# Patient Record
Sex: Female | Born: 1990 | Race: Black or African American | Hispanic: No | Marital: Single | Smoking: Never smoker
Health system: Southern US, Community
[De-identification: ages and names within clinical notes are randomized; demographics above are authoritative.]

---

## 2019-06-16 ENCOUNTER — Other Ambulatory Visit: Payer: Self-pay

## 2019-06-16 ENCOUNTER — Emergency Department (HOSPITAL_BASED_OUTPATIENT_CLINIC_OR_DEPARTMENT_OTHER)
Admission: EM | Admit: 2019-06-16 | Discharge: 2019-06-17 | Disposition: A | Discharge status: Home / Self Care | Payer: Medicaid Other | Attending: Emergency Medicine | Admitting: Emergency Medicine

## 2019-06-16 ENCOUNTER — Encounter (HOSPITAL_BASED_OUTPATIENT_CLINIC_OR_DEPARTMENT_OTHER): Payer: Self-pay | Admitting: *Deleted

## 2019-06-16 DIAGNOSIS — R6889 Other general symptoms and signs: Secondary | ICD-10-CM

## 2019-06-16 DIAGNOSIS — R6883 Chills (without fever): Secondary | ICD-10-CM | POA: Diagnosis present

## 2019-06-16 DIAGNOSIS — M7918 Myalgia, other site: Secondary | ICD-10-CM | POA: Diagnosis not present

## 2019-06-16 DIAGNOSIS — J111 Influenza due to unidentified influenza virus with other respiratory manifestations: Secondary | ICD-10-CM | POA: Insufficient documentation

## 2019-06-16 LAB — URINALYSIS, ROUTINE W REFLEX MICROSCOPIC
Bilirubin Urine: NEGATIVE
Glucose, UA: NEGATIVE mg/dL
Hgb urine dipstick: NEGATIVE
Ketones, ur: NEGATIVE mg/dL
Leukocytes,Ua: NEGATIVE
Nitrite: NEGATIVE
Protein, ur: NEGATIVE mg/dL
Specific Gravity, Urine: <1.005 — ABNORMAL LOW (ref 1.005–1.030)
pH: 6 (ref 5.0–8.0)

## 2019-06-16 LAB — RAPID INFLUENZA A&B ANTIGENS
Influenza A (ARMC): NEGATIVE
Influenza B (ARMC): NEGATIVE

## 2019-06-16 NOTE — ED Provider Notes (Signed)
[facility] EMERGENCY DEPARTMENT Provider Note   CSN: [id number] Arrival date & time: 06/16/19  2150     History No chief complaint on file.   Angelica Walsh is a 29 y.o. female.  Patient is a 29 year old female who presents with chills.  She states that today she noticed that she was having some achiness in her muscles and had an episode where she had some chills.  She has a mild headache.  No known fevers.  No cough or cold symptoms.  No shortness of breath.  No urinary symptoms.  She was recently treated for bacterial vaginosis a week ago and currently finished her last dose of Flagyl today.  She denies any significant abdominal pain.  No vomiting or diarrhea.  No rashes.  She had Covid at the end of November.        History reviewed. No pertinent past medical history.  There are no problems to display for this patient.   History reviewed. No pertinent surgical history.   OB History   No obstetric history on file.     No family history on file.  Social History   Tobacco Use  . Smoking status: Never Smoker  . Smokeless tobacco: Never Used  Substance Use Topics  . Alcohol use: Yes  . Drug use: Never    Home Medications Prior to Admission medications   Not on File    Allergies    Patient has no known allergies.  Review of Systems   Review of Systems  Constitutional: Positive for chills and fatigue. Negative for diaphoresis and fever.  HENT: Negative for congestion, rhinorrhea and sneezing.   Eyes: Negative.   Respiratory: Negative for cough, chest tightness and shortness of breath.   Cardiovascular: Negative for chest pain and leg swelling.  Gastrointestinal: Negative for abdominal pain, blood in stool, diarrhea, nausea and vomiting.  Genitourinary: Negative for difficulty urinating, flank pain, frequency and hematuria.  Musculoskeletal: Positive for myalgias. Negative for arthralgias and back pain.  Skin: Negative for rash.  Neurological:  Negative for dizziness, speech difficulty, weakness, numbness and headaches.    Physical Exam Updated Vital Signs BP 116/79   Pulse 89   Temp 98.8 F (37.1 C) (Oral)   Resp 18   Ht 5\' 3"  (1.6 m)   Wt 102.5 kg   LMP 05/23/2019   SpO2 100%   BMI 40.03 kg/m   Physical Exam Constitutional:      Appearance: She is well-developed.  HENT:     Head: Normocephalic and atraumatic.  Eyes:     Pupils: Pupils are equal, round, and reactive to light.  Cardiovascular:     Rate and Rhythm: Normal rate and regular rhythm.     Heart sounds: Normal heart sounds.  Pulmonary:     Effort: Pulmonary effort is normal. No respiratory distress.     Breath sounds: Normal breath sounds. No wheezing or rales.  Chest:     Chest wall: No tenderness.  Abdominal:     General: Bowel sounds are normal.     Palpations: Abdomen is soft.     Tenderness: There is no abdominal tenderness. There is no guarding or rebound.  Musculoskeletal:        General: Normal range of motion.     Cervical back: Normal range of motion and neck supple.  Lymphadenopathy:     Cervical: No cervical adenopathy.  Skin:    General: Skin is warm and dry.     Findings: No rash.  Neurological:     Mental Status: She is alert and oriented to person, place, and time.     ED Results / Procedures / Treatments   Labs (all labs ordered are listed, but only abnormal results are displayed) Labs Reviewed  RAPID INFLUENZA A&B ANTIGENS  URINALYSIS, ROUTINE W REFLEX MICROSCOPIC  PREGNANCY, URINE    EKG None  Radiology No results found.  Procedures Procedures (including critical care time)  Medications Ordered in ED Medications - No data to display  ED Course  I have reviewed the triage vital signs and the nursing notes.  Pertinent labs & imaging results that were available during my care of the patient were reviewed by me and considered in my medical decision making (see chart for details).    MDM  Rules/Calculators/A&P                      Patient is a 29 year old female who presents with chills and myalgias.  No documented fevers.  No URI symptoms.  No cough or chest congestion.  She had Covid in November.  She had a rapid flu that was negative.  U/a is pending.  She is otherwise well appearing with normal vital signs and I anticipate discharge.  Dr. Read Drivers to take over care pending u/a. Final Clinical Impression(s) / ED Diagnoses Final diagnoses:  None    Rx / DC Orders ED Discharge Orders    None       Rolan Bucco, MD 06/16/19 2330

## 2019-06-16 NOTE — ED Triage Notes (Signed)
She had Covid a month ago. Here today with chills, body aches, and headache.

## 2019-06-16 NOTE — ED Notes (Signed)
Pt is aware that a urine specimen is needed.

## 2019-06-17 LAB — PREGNANCY, URINE: Preg Test, Ur: NEGATIVE

## 2019-07-31 ENCOUNTER — Encounter (HOSPITAL_BASED_OUTPATIENT_CLINIC_OR_DEPARTMENT_OTHER): Payer: Self-pay | Admitting: Emergency Medicine

## 2019-07-31 ENCOUNTER — Emergency Department (HOSPITAL_BASED_OUTPATIENT_CLINIC_OR_DEPARTMENT_OTHER)
Admission: EM | Admit: 2019-07-31 | Discharge: 2019-07-31 | Disposition: A | Discharge status: Home / Self Care | Payer: Medicaid Other | Attending: Emergency Medicine | Admitting: Emergency Medicine

## 2019-07-31 ENCOUNTER — Other Ambulatory Visit: Payer: Self-pay

## 2019-07-31 DIAGNOSIS — R509 Fever, unspecified: Secondary | ICD-10-CM | POA: Diagnosis present

## 2019-07-31 DIAGNOSIS — J111 Influenza due to unidentified influenza virus with other respiratory manifestations: Secondary | ICD-10-CM | POA: Insufficient documentation

## 2019-07-31 DIAGNOSIS — Z20822 Contact with and (suspected) exposure to covid-19: Secondary | ICD-10-CM | POA: Insufficient documentation

## 2019-07-31 NOTE — ED Provider Notes (Addendum)
[facility] EMERGENCY DEPARTMENT Provider Note   CSN: [id number] Arrival date & time: 07/31/19  1204     History Chief Complaint  Patient presents with  . Generalized Body Aches  . Headache    Angelica Walsh is a 29 y.o. female.  29 yo F with a chief complaint of fevers, chills and myalgias.  Going on for about a week now.  Patient has been seen in urgent care twice as well as seeing the family doctor couple days ago.  She has had a headache off and on with this.  Was started on antibiotics for possible sinusitis.  Denies cough denies shortness of breath denies nausea vomiting or diarrhea denies abdominal pain.  The history is provided by the patient.  Headache Associated symptoms: fever   Associated symptoms: no congestion, no dizziness, no myalgias, no nausea and no vomiting   Illness Severity:  Moderate Onset quality:  Gradual Duration:  1 week Timing:  Constant Progression:  Worsening Chronicity:  New Associated symptoms: fever and headaches   Associated symptoms: no chest pain, no congestion, no myalgias, no nausea, no rhinorrhea, no shortness of breath, no vomiting and no wheezing        History reviewed. No pertinent past medical history.  There are no problems to display for this patient.   History reviewed. No pertinent surgical history.   OB History   No obstetric history on file.     No family history on file.  Social History   Tobacco Use  . Smoking status: Never Smoker  . Smokeless tobacco: Never Used  Substance Use Topics  . Alcohol use: Yes  . Drug use: Never    Home Medications Prior to Admission medications   Not on File    Allergies    Patient has no known allergies.  Review of Systems   Review of Systems  Constitutional: Positive for chills and fever.  HENT: Negative for congestion and rhinorrhea.   Eyes: Negative for redness and visual disturbance.  Respiratory: Negative for shortness of breath and wheezing.     Cardiovascular: Negative for chest pain and palpitations.  Gastrointestinal: Negative for nausea and vomiting.  Genitourinary: Negative for dysuria and urgency.  Musculoskeletal: Negative for arthralgias and myalgias.  Skin: Negative for pallor and wound.  Neurological: Positive for headaches. Negative for dizziness.    Physical Exam Updated Vital Signs BP (!) 131/92 (BP Location: Left Arm)   Pulse 83   Temp 98.7 F (37.1 C) (Oral)   Resp 18   Ht 5\' 3"  (1.6 m)   Wt 101.2 kg   LMP 07/24/2019   SpO2 100%   BMI 39.50 kg/m   Physical Exam Vitals and nursing note reviewed.  Constitutional:      General: She is not in acute distress.    Appearance: She is well-developed. She is not diaphoretic.  HENT:     Head: Normocephalic and atraumatic.     Comments: Swollen turbinates, posterior nasal drip, no noted sinus ttp, tm normal bilaterally.   Eyes:     Pupils: Pupils are equal, round, and reactive to light.  Cardiovascular:     Rate and Rhythm: Normal rate and regular rhythm.     Heart sounds: No murmur. No friction rub. No gallop.   Pulmonary:     Effort: Pulmonary effort is normal.     Breath sounds: No wheezing or rales.  Abdominal:     General: There is no distension.     Palpations: Abdomen is  soft.     Tenderness: There is no abdominal tenderness.  Musculoskeletal:        General: No tenderness.     Cervical back: Normal range of motion and neck supple.  Skin:    General: Skin is warm and dry.  Neurological:     Mental Status: She is alert and oriented to person, place, and time.  Psychiatric:        Behavior: Behavior normal.     ED Results / Procedures / Treatments   Labs (all labs ordered are listed, but only abnormal results are displayed) Labs Reviewed  NOVEL CORONAVIRUS, NAA (HOSP ORDER, SEND-OUT TO REF LAB; TAT 18-24 HRS)    EKG None  Radiology No results found.  Procedures Procedures (including critical care time)  Medications Ordered in  ED Medications - No data to display  ED Course  I have reviewed the triage vital signs and the nursing notes.  Pertinent labs & imaging results that were available during my care of the patient were reviewed by me and considered in my medical decision making (see chart for details).    MDM Rules/Calculators/A&P                      29 yo F with a cc of fevers and chills.  Occurring during the coronavirus pandemic.  Will test. Well appearing, afebrile here.  No sob.  No signs of meningitis, no neck stiffness or rigidity negative Kernig's and Brudzinski's.  Discharge home.  Anylah Scheib was evaluated in Emergency Department on 07/31/2019 for the symptoms described in the history of present illness. He/she was evaluated in the context of the global COVID-19 pandemic, which necessitated consideration that the patient might be at risk for infection with the SARS-CoV-2 virus that causes COVID-19. Institutional protocols and algorithms that pertain to the evaluation of patients at risk for COVID-19 are in a state of rapid change based on information released by regulatory bodies including the CDC and federal and state organizations. These policies and algorithms were followed during the patient's care in the ED.    3:30 PM:  I have discussed the diagnosis/risks/treatment options with the patient and believe the pt to be eligible for discharge home to follow-up with PCP. We also discussed returning to the ED immediately if new or worsening sx occur. We discussed the sx which are most concerning (e.g., sudden worsening pain, fever, inability to tolerate by mouth) that necessitate immediate return. Medications administered to the patient during their visit and any new prescriptions provided to the patient are listed below.  Medications given during this visit Medications - No data to display   The patient appears reasonably screen and/or stabilized for discharge and I doubt any other medical  condition or other [facility] requiring further screening, evaluation, or treatment in the ED at this time prior to discharge.   Final Clinical Impression(s) / ED Diagnoses Final diagnoses:  Influenza-like illness    Rx / DC Orders ED Discharge Orders    None       Melene Plan, DO 07/31/19 1530    Melene Plan, DO 07/31/19 1544

## 2019-07-31 NOTE — ED Triage Notes (Addendum)
Pt states that she has had generalized body aches since Friday  - pt states that it is worse with movement. She denies any pain at this time

## 2019-07-31 NOTE — Discharge Instructions (Signed)
Return for worsening trouble breathing, abdominal pain, inability to eat or drink.

## 2019-08-01 ENCOUNTER — Telehealth: Payer: Self-pay | Admitting: *Deleted

## 2019-08-01 LAB — NOVEL CORONAVIRUS, NAA (HOSP ORDER, SEND-OUT TO REF LAB; TAT 18-24 HRS): SARS-CoV-2, NAA: NOT DETECTED

## 2019-08-01 NOTE — Telephone Encounter (Signed)
Patient called given negative covid results,patient requested contact information for [facility]

## 2020-04-14 ENCOUNTER — Emergency Department (HOSPITAL_BASED_OUTPATIENT_CLINIC_OR_DEPARTMENT_OTHER): Payer: Medicaid Other

## 2020-04-14 ENCOUNTER — Encounter (HOSPITAL_BASED_OUTPATIENT_CLINIC_OR_DEPARTMENT_OTHER): Payer: Self-pay | Admitting: *Deleted

## 2020-04-14 ENCOUNTER — Other Ambulatory Visit: Payer: Self-pay

## 2020-04-14 ENCOUNTER — Emergency Department (HOSPITAL_BASED_OUTPATIENT_CLINIC_OR_DEPARTMENT_OTHER)
Admission: EM | Admit: 2020-04-14 | Discharge: 2020-04-15 | Disposition: A | Discharge status: Home / Self Care | Payer: Medicaid Other | Attending: Emergency Medicine | Admitting: Emergency Medicine

## 2020-04-14 DIAGNOSIS — R0602 Shortness of breath: Secondary | ICD-10-CM | POA: Insufficient documentation

## 2020-04-14 DIAGNOSIS — K219 Gastro-esophageal reflux disease without esophagitis: Secondary | ICD-10-CM | POA: Diagnosis not present

## 2020-04-14 DIAGNOSIS — R1012 Left upper quadrant pain: Secondary | ICD-10-CM | POA: Diagnosis present

## 2020-04-14 MED ORDER — ALUM & MAG HYDROXIDE-SIMETH 200-200-20 MG/5ML PO SUSP
30.0000 mL | Freq: Once | ORAL | Status: AC
  Administered 2020-04-14: 30 mL via ORAL
  Filled 2020-04-14: qty 30

## 2020-04-14 MED ORDER — LIDOCAINE VISCOUS HCL 2 % MT SOLN
15.0000 mL | Freq: Once | OROMUCOSAL | Status: AC
  Administered 2020-04-14: 15 mL via ORAL
  Filled 2020-04-14: qty 15

## 2020-04-14 NOTE — ED Notes (Signed)
Pt to Xray.

## 2020-04-14 NOTE — ED Notes (Signed)
EDP at bedside  

## 2020-04-14 NOTE — ED Triage Notes (Signed)
Reports having heartburn for several days.  Ate dinner around 7pm felt full and SOB.  Reports she induced vomiting thinking it would help.  Reports it didn't help but continued to vomit multiple times since.

## 2020-04-15 NOTE — ED Provider Notes (Signed)
[facility] EMERGENCY DEPARTMENT Provider Note  CSN: [id number] Arrival date & time: 04/14/20 2210  Chief Complaint(s) Heartburn and Shortness of Breath  HPI Angelica Walsh is a 29 y.o. female   The history is provided by the patient.  Gastroesophageal Reflux This is a recurrent problem. The current episode started 1 to 2 hours ago. The problem occurs constantly. The problem has not changed since onset.Associated symptoms include abdominal pain (LUQ discomfort) and shortness of breath (after emesis). Pertinent negatives include no chest pain and no headaches. The symptoms are aggravated by eating. Relieved by: self induced emesis. She has tried nothing for the symptoms.    Past Medical History History reviewed. No pertinent past medical history. There are no problems to display for this patient.  Home Medication(s) Prior to Admission medications   Not on File                                                                                                                                    Past Surgical History History reviewed. No pertinent surgical history. Family History No family history on file.  Social History Social History   Tobacco Use  . Smoking status: Never Smoker  . Smokeless tobacco: Never Used  Vaping Use  . Vaping Use: Never used  Substance Use Topics  . Alcohol use: Yes  . Drug use: Never   Allergies Patient has no known allergies.  Review of Systems Review of Systems  Respiratory: Positive for shortness of breath (after emesis).   Cardiovascular: Negative for chest pain.  Gastrointestinal: Positive for abdominal pain (LUQ discomfort).  Neurological: Negative for headaches.   All other systems are reviewed and are negative for acute change except as noted in the HPI  Physical Exam Vital Signs  I have reviewed the triage vital signs BP 111/69   Pulse 100   Temp 98.6 F (37 C) (Oral)   Resp 20   Ht 5\' 3"  (1.6 m)   Wt 102.5 kg    LMP 03/17/2020   SpO2 100%   BMI 40.03 kg/m   Physical Exam Vitals reviewed.  Constitutional:      General: She is not in acute distress.    Appearance: She is well-developed and overweight. She is not diaphoretic.  HENT:     Head: Normocephalic and atraumatic.     Nose: Nose normal.  Eyes:     General: No scleral icterus.       Right eye: No discharge.        Left eye: No discharge.     Conjunctiva/sclera: Conjunctivae normal.     Pupils: Pupils are equal, round, and reactive to light.  Cardiovascular:     Rate and Rhythm: Normal rate and regular rhythm.     Heart sounds: No murmur heard.  No friction rub. No gallop.   Pulmonary:     Effort: Pulmonary effort is normal. No respiratory distress.  Breath sounds: Normal breath sounds. No stridor. No rales.  Abdominal:     General: There is no distension.     Palpations: Abdomen is soft.     Tenderness: There is no abdominal tenderness.  Musculoskeletal:        General: No tenderness.     Cervical back: Normal range of motion and neck supple.  Skin:    General: Skin is warm and dry.     Findings: No erythema or rash.  Neurological:     Mental Status: She is alert and oriented to person, place, and time.     ED Results and Treatments Labs (all labs ordered are listed, but only abnormal results are displayed) Labs Reviewed - No data to display                                                                                                                       EKG  EKG Interpretation  Date/Time:  Saturday April 14 2020 22:35:30 EDT Ventricular Rate:  79 PR Interval:    QRS Duration: 96 QT Interval:  376 QTC Calculation: 431 R Axis:   54 Text Interpretation: Sinus rhythm NO STEMI. No old tracing to compare Confirmed by Drema Pry [phone]) on 04/14/2020 11:39:18 PM      Radiology DG Chest 2 View  Result Date: 04/14/2020 CLINICAL DATA:  Shortness of breath, heartburn for several days EXAM: CHEST - 2 VIEW  COMPARISON:  Radiograph 06/02/2019 FINDINGS: No consolidation, features of edema, pneumothorax, or effusion. Pulmonary vascularity is normally distributed. The cardiomediastinal contours are unremarkable. No acute osseous or soft tissue abnormality. IMPRESSION: No acute cardiopulmonary abnormality. Electronically Signed   By: Kreg Shropshire M.D.   On: 04/14/2020 22:56    Pertinent labs & imaging results that were available during my care of the patient were reviewed by me and considered in my medical decision making (see chart for details).  Medications Ordered in ED Medications  alum & mag hydroxide-simeth (MAALOX/MYLANTA) 200-200-20 MG/5ML suspension 30 mL (30 mLs Oral Given 04/14/20 2347)    And  lidocaine (XYLOCAINE) 2 % viscous mouth solution 15 mL (15 mLs Oral Given 04/14/20 2347)                                                                                                                                    Procedures Procedures  (including critical care time)  Medical Decision Making /  ED Course I have reviewed the nursing notes for this encounter and the patient's prior records (if available in EHR or on provided paperwork).   Angelica Walsh was evaluated in Emergency Department on 04/15/2020 for the symptoms described in the history of present illness. She was evaluated in the context of the global COVID-19 pandemic, which necessitated consideration that the patient might be at risk for infection with the SARS-CoV-2 virus that causes COVID-19. Institutional protocols and algorithms that pertain to the evaluation of patients at risk for COVID-19 are in a state of rapid change based on information released by regulatory bodies including the CDC and federal and state organizations. These policies and algorithms were followed during the patient's care in the ED.  Presented with upper abd discomfort after heavy meal. Self induced emesis, which usually helps, did not today. Mild SOB related  to episode, not currently.  abd benign. Lungs clear.  Triage orders: EKG: nonischemic CXR: without evidence suggestive of pneumonia, pneumothorax, pneumomediastinum.  No abnormal contour of the mediastinum to suggest dissection. No evidence of acute injuries.  Low suspicion for serious intra-abdominal [place]/infectious process or bowel obstruction quiring additional work-up or imaging at this time.  On review of records, recent labs revealed the patient is not a diabetic. Offered pregnancy test which patient declined.  She was provided with GI cocktail which completely resolved her symptoms.  She is able to tolerate oral intake.     Final Clinical Impression(s) / ED Diagnoses Final diagnoses:  Gastroesophageal reflux disease, unspecified whether esophagitis present   The patient appears reasonably screened and/or stabilized for discharge and I doubt any other medical condition or other [facility] requiring further screening, evaluation, or treatment in the ED at this time prior to discharge. Safe for discharge with strict return precautions.  Disposition: Discharge  Condition: Good  I have discussed the results, Dx and Tx plan with the patient/family who expressed understanding and agree(s) with the plan. Discharge instructions discussed at length. The patient/family was given strict return precautions who verbalized understanding of the instructions. No further questions at time of discharge.    ED Discharge Orders    None       Follow Up: Primary care provider   If you do not have a primary care physician, contact [facility] at [phone] for referral      This chart was dictated using voice recognition software.  Despite best efforts to proofread,  errors can occur which can change the documentation meaning.   Nira Conn, MD 04/15/20 [phone]

## 2020-06-12 ENCOUNTER — Other Ambulatory Visit: Payer: Self-pay

## 2020-06-12 ENCOUNTER — Emergency Department (HOSPITAL_BASED_OUTPATIENT_CLINIC_OR_DEPARTMENT_OTHER)
Admission: EM | Admit: 2020-06-12 | Discharge: 2020-06-12 | Disposition: A | Discharge status: Home / Self Care | Payer: Medicaid Other | Attending: Emergency Medicine | Admitting: Emergency Medicine

## 2020-06-12 ENCOUNTER — Encounter (HOSPITAL_BASED_OUTPATIENT_CLINIC_OR_DEPARTMENT_OTHER): Payer: Self-pay | Admitting: Emergency Medicine

## 2020-06-12 DIAGNOSIS — U071 COVID-19: Secondary | ICD-10-CM | POA: Insufficient documentation

## 2020-06-12 DIAGNOSIS — R059 Cough, unspecified: Secondary | ICD-10-CM | POA: Diagnosis present

## 2020-06-12 DIAGNOSIS — J069 Acute upper respiratory infection, unspecified: Secondary | ICD-10-CM

## 2020-06-12 DIAGNOSIS — Z20822 Contact with and (suspected) exposure to covid-19: Secondary | ICD-10-CM

## 2020-06-12 LAB — RESP PANEL BY RT-PCR (FLU A&B, COVID) ARPGX2
Influenza A by PCR: NEGATIVE
Influenza B by PCR: NEGATIVE
SARS Coronavirus 2 by RT PCR: POSITIVE — AB

## 2020-06-12 MED ORDER — BENZONATATE 100 MG PO CAPS: 100.0000 mg | ORAL_CAPSULE | Freq: Three times a day (TID) | ORAL | 0 refills | Status: AC

## 2020-06-12 NOTE — ED Notes (Signed)
Covid Swab obtained and to the lab 

## 2020-06-12 NOTE — Discharge Instructions (Addendum)
At this time there does not appear to be the presence of an emergent medical condition, however there is always the potential for conditions to change. Please read and follow the below instructions.  Please return to the Emergency Department immediately for any new or worsening symptoms. Please be sure to follow up with your Primary Care Provider within one week regarding your visit today; please call their office to schedule an appointment even if you are feeling better for a follow-up visit. Your Covid test and influenza tests are currently in process they should result in the next 1 day.  Please check your MyChart account for those results and discuss them with your primary care provider as soon as they are available.  Please drink plenty of water and get plenty of rest.  You may use over-the-counter anti-inflammatory such as Tylenol as directed on the packaging to help with your symptoms. You may use the medication Tessalon as prescribed to help with your cough.  If you are pregnant or breast-feeding do not take this medication ask your OB/GYN before taking any cough medications.  Go to the nearest Emergency Department immediately if: You have fever or chills You feel pain or pressure in your chest. You have abdominal pain You have vomiting and cannot keep down food or water if your Covid test is positive you will need to quarantine for 10 days.   You have shortness of breath. You faint or feel like you will faint. You keep throwing up (vomiting). You feel confused. You have any new/concerning or worsening symptoms.   Please read the additional information packets attached to your discharge summary.  Do not take your medicine if  develop an itchy rash, swelling in your mouth or lips, or difficulty breathing; call 911 and seek immediate emergency medical attention if this occurs.  You may review your lab tests and imaging results in their entirety on your MyChart account.  Please discuss  all results of fully with your primary care provider and other specialist at your follow-up visit.  Note: Portions of this text may have been transcribed using voice recognition software. Every effort was made to ensure accuracy; however, inadvertent computerized transcription errors may still be present.

## 2020-06-12 NOTE — ED Provider Notes (Signed)
[facility] EMERGENCY DEPARTMENT Provider Note   CSN: [id number] Arrival date & time: 06/12/20  1326     History Chief Complaint  Patient presents with  . Cough    Angelica Walsh is a 30 y.o. female reports herself as otherwise healthy no daily medication use presents today for Covid and flu test.  Patient reports that her nephew tested positive for influenza A last week.  Patient reports that yesterday she developed nasal congestion, rhinorrhea, generalized body aches and nonproductive cough.  Symptoms have been constant since onset she has been using over-the-counter flu and cold remedies with some relief.  She reports body aches as a mild aching sensation constant nonradiating no aggravating factors.  Denies fever/chills, headache, vision changes, neck stiffness, sore throat, chest pain/shortness of breath, hemoptysis, abdominal pain, nausea/vomiting, diarrhea, extremity swelling/color change or any additional concerns.  Of note patient reports that she is unvaccinated against flu and Covid  HPI     History reviewed. No pertinent past medical history.  There are no problems to display for this patient.   History reviewed. No pertinent surgical history.   OB History   No obstetric history on file.     History reviewed. No pertinent family history.  Social History   Tobacco Use  . Smoking status: Never Smoker  . Smokeless tobacco: Never Used  Vaping Use  . Vaping Use: Never used  Substance Use Topics  . Alcohol use: Yes  . Drug use: Never    Home Medications Prior to Admission medications   Medication Sig Start Date End Date Taking? Authorizing Provider  benzonatate (TESSALON) 100 MG capsule Take 1 capsule (100 mg total) by mouth every 8 (eight) hours. 06/12/20  Yes Harlene Salts A, PA-C    Allergies    Patient has no known allergies.  Review of Systems   Review of Systems  Constitutional: Negative.  Negative for chills and fever.  HENT:  Positive for rhinorrhea. Negative for facial swelling, sore throat and voice change.   Eyes: Negative.  Negative for visual disturbance.  Respiratory: Positive for cough. Negative for shortness of breath.   Cardiovascular: Negative.  Negative for chest pain and leg swelling.  Gastrointestinal: Negative.  Negative for abdominal pain, diarrhea, nausea and vomiting.  Musculoskeletal: Positive for arthralgias and myalgias.  Neurological: Negative.  Negative for headaches.    Physical Exam Updated Vital Signs BP 105/78 (BP Location: Left Arm)   Pulse 75   Temp 98.8 F (37.1 C) (Oral)   Resp 16   Ht 5\' 3"  (1.6 m)   Wt 96.6 kg   LMP 06/05/2020   SpO2 100%   BMI 37.73 kg/m   Physical Exam Constitutional:      General: She is not in acute distress.    Appearance: Normal appearance. She is well-developed. She is not ill-appearing or diaphoretic.  HENT:     Head: Normocephalic and atraumatic.     Jaw: There is normal jaw occlusion.     Right Ear: External ear normal.     Left Ear: External ear normal.     Nose: Rhinorrhea present. Rhinorrhea is clear.     Right Sinus: No maxillary sinus tenderness or frontal sinus tenderness.     Left Sinus: No maxillary sinus tenderness or frontal sinus tenderness.     Mouth/Throat:     Mouth: Mucous membranes are moist.     Pharynx: Oropharynx is clear. Uvula midline.     Comments: Mild postnasal drip and posterior pharynx cobblestoning  Eyes:     General: Vision grossly intact. Gaze aligned appropriately.     Extraocular Movements: Extraocular movements intact.     Conjunctiva/sclera: Conjunctivae normal.     Pupils: Pupils are equal, round, and reactive to light.  Neck:     Trachea: Trachea and phonation normal. No tracheal tenderness or tracheal deviation.     Meningeal: Brudzinski's sign absent.  Cardiovascular:     Rate and Rhythm: Normal rate and regular rhythm.  Pulmonary:     Effort: Pulmonary effort is normal. No accessory muscle  usage or respiratory distress.     Breath sounds: Normal breath sounds and air entry.  Abdominal:     General: There is no distension.     Palpations: Abdomen is soft.     Tenderness: There is no abdominal tenderness. There is no guarding or rebound.  Musculoskeletal:        General: Normal range of motion.     Cervical back: Normal range of motion and neck supple.     Right lower leg: No edema.     Left lower leg: No edema.  Skin:    General: Skin is warm and dry.  Neurological:     Mental Status: She is alert.     GCS: GCS eye subscore is 4. GCS verbal subscore is 5. GCS motor subscore is 6.     Comments: Speech is clear and goal oriented, follows commands Major Cranial nerves without deficit, no facial droop Moves extremities without ataxia, coordination intact  Psychiatric:        Behavior: Behavior normal.     ED Results / Procedures / Treatments   Labs (all labs ordered are listed, but only abnormal results are displayed) Labs Reviewed  RAPID INFLUENZA A&B ANTIGENS  SARS CORONAVIRUS 2 (TAT 6-24 HRS)    EKG None  Radiology No results found.  Procedures Procedures (including critical care time)  Medications Ordered in ED Medications - No data to display  ED Course  I have reviewed the triage vital signs and the nursing notes.  Pertinent labs & imaging results that were available during my care of the patient were reviewed by me and considered in my medical decision making (see chart for details).    MDM Rules/Calculators/A&P                         Additional history obtained from: 1. Nursing notes from this visit. ----------- 30 year old otherwise healthy female presenting for evaluation URI illness onset yesterday.  She reports rhinorrhea, congestion, body aches and nonproductive cough.  She has no chest pain or shortness of breath, no GI symptoms.  She reports that her nephew recently tested positive for influenza A.  Patient is unvaccinated against  influenza and Covid.  On exam she is well-appearing no acute distress.  Cranial nerves intact, no meningeal signs.  Airway clear without evidence of PTA, RPA, Ludewig's, tonsillitis, sinusitis or other deep space infections of the head or neck.  Cardiopulmonary exam unremarkable.  Abdomen soft nontender without peritoneal signs.  Neurovascular tact to all 4 extremities without evidence of DVT.  Vital signs are within normal limits without hypoxia, tachycardia, tachypnea, hypotension or fever.  There is no indication for imaging or blood work at this time.  Will obtain Covid and influenza tests.  Patient requesting discharge will discharge patient to home and she can follow-up on her test on her MyChart account.  Patient encouraged to call her PCP once her  results are available and schedule follow-up appointment.  Encourage patient to drink plenty water to avoid dehydration and get plenty of rest.  Discussed isolation and quarantine precautions with patient and she stated understanding.  Work note given.  Tessalon prescribed for cough.  Suspect patient symptoms today secondary to viral URI likely Covid versus influenza.  No evidence for bacterial infection requiring antibiotic or further work-up at this time.  Of note patient denies chance of pregnancy today.  Patient states understanding that medications given or prescribed today may result in harm to of a pregnancy and she accepts these risks and still chooses not to be pregnancy tested and proceed with medications.  Additionally patient denies breast-feeding.  At this time there does not appear to be any evidence of an acute emergency medical condition and the patient appears stable for discharge with appropriate outpatient follow up. Diagnosis was discussed with patient who verbalizes understanding of care plan and is agreeable to discharge. I have discussed return precautions with patient who verbalizes understanding. Patient encouraged to follow-up with  their PCP . All questions answered.   Angelica Walsh was evaluated in Emergency Department on 06/12/2020 for the symptoms described in the history of present illness. She was evaluated in the context of the global COVID-19 pandemic, which necessitated consideration that the patient might be at risk for infection with the SARS-CoV-2 virus that causes COVID-19. Institutional protocols and algorithms that pertain to the evaluation of patients at risk for COVID-19 are in a state of rapid change based on information released by regulatory bodies including the CDC and federal and state organizations. These policies and algorithms were followed during the patient's care in the ED.   Note: Portions of this report may have been transcribed using voice recognition software. Every effort was made to ensure accuracy; however, inadvertent computerized transcription errors may still be present. Final Clinical Impression(s) / ED Diagnoses Final diagnoses:  Viral URI with cough  Encounter for laboratory testing for COVID-19 virus    Rx / DC Orders ED Discharge Orders         Ordered    benzonatate (TESSALON) 100 MG capsule  Every 8 hours        06/12/20 1907           Elizabeth Palau 06/12/20 1908    Milagros Loll, MD 06/19/20 [phone]

## 2020-06-12 NOTE — ED Notes (Signed)
Pt made aware of longest wait in WR at time of triage

## 2020-06-12 NOTE — ED Triage Notes (Signed)
Pt arrives pov with c/o cough with congestion and generalized body aches that started yesterday. Pt denies fever

## 2020-06-13 ENCOUNTER — Ambulatory Visit: Payer: Self-pay | Admitting: *Deleted

## 2020-06-13 NOTE — Telephone Encounter (Signed)
Patient notified of positive COVID-19 test results. Pt verbalized understanding. Pt reports symptoms of flu like symptoms Criteria for self-isolation:  -Please quarantine and isolate at home  for at least 10 days since symptoms started AND - At least 24 hours fever free without the use of fever reducing medications such as Tylenol or Ibuprofen AND - Improvement in respiratory symptoms Use over-the-counter medications for symptoms.If you develop respiratory issues/distress, seek medical care in the Emergency Department.  If you must leave home or if you have to be around others please wear a mask. Please limit contact with immediate family members in the home, practice social distancing, frequent handwashing and clean hard surfaces touched frequently with household cleaning products. Members of your household will also need to quarantine and test. Pt informed that the health department will likely follow up and may have additional recommendations. Will notify [facility] Department.

## 2021-10-10 ENCOUNTER — Encounter (HOSPITAL_BASED_OUTPATIENT_CLINIC_OR_DEPARTMENT_OTHER): Payer: Self-pay

## 2021-10-10 ENCOUNTER — Emergency Department (HOSPITAL_BASED_OUTPATIENT_CLINIC_OR_DEPARTMENT_OTHER)
Admission: EM | Admit: 2021-10-10 | Discharge: 2021-10-10 | Disposition: A | Discharge status: Home / Self Care | Payer: Medicaid Other | Attending: Emergency Medicine | Admitting: Emergency Medicine

## 2021-10-10 ENCOUNTER — Emergency Department (HOSPITAL_BASED_OUTPATIENT_CLINIC_OR_DEPARTMENT_OTHER): Payer: Medicaid Other

## 2021-10-10 DIAGNOSIS — Y92410 Unspecified street and highway as the place of occurrence of the external cause: Secondary | ICD-10-CM | POA: Diagnosis not present

## 2021-10-10 DIAGNOSIS — M79644 Pain in right finger(s): Secondary | ICD-10-CM | POA: Diagnosis not present

## 2021-10-10 DIAGNOSIS — R0789 Other chest pain: Secondary | ICD-10-CM | POA: Diagnosis not present

## 2021-10-10 MED ORDER — KETOROLAC TROMETHAMINE 15 MG/ML IJ SOLN
15.0000 mg | Freq: Once | INTRAMUSCULAR | Status: AC
  Administered 2021-10-10: 15 mg via INTRAMUSCULAR
  Filled 2021-10-10: qty 1
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
## 2021-10-10 NOTE — ED Notes (Signed)
Pt care taken, said her chest still hurts with breathing, but her hand does not hurt anymore. Awaiting physician, no complaints at this time ?

## 2021-10-10 NOTE — Discharge Instructions (Signed)
You will hurt worse tomorrow this is normal.  Please return for confusion vomiting difficulty breathing severe abdominal discomfort.  Otherwise your symptoms should continue to improve throughout the week.  If you are still having significant symptoms in about a week your family doctor can order an imaging study for you. ? ?Take 4 over the counter ibuprofen tablets 3 times a day or 2 over-the-counter naproxen tablets twice a day for pain. ?Also take tylenol 1000mg (2 extra strength) four times a day.  ? ? ?
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
## 2022-01-16 ENCOUNTER — Encounter (HOSPITAL_BASED_OUTPATIENT_CLINIC_OR_DEPARTMENT_OTHER): Payer: Self-pay | Admitting: Emergency Medicine

## 2022-01-16 ENCOUNTER — Other Ambulatory Visit: Payer: Self-pay

## 2022-01-16 ENCOUNTER — Emergency Department (HOSPITAL_BASED_OUTPATIENT_CLINIC_OR_DEPARTMENT_OTHER)
Admission: EM | Admit: 2022-01-16 | Discharge: 2022-01-16 | Disposition: A | Discharge status: Home / Self Care | Payer: Medicaid Other | Attending: Emergency Medicine | Admitting: Emergency Medicine

## 2022-01-16 ENCOUNTER — Emergency Department (HOSPITAL_BASED_OUTPATIENT_CLINIC_OR_DEPARTMENT_OTHER): Payer: Medicaid Other

## 2022-01-16 DIAGNOSIS — M79662 Pain in left lower leg: Secondary | ICD-10-CM | POA: Diagnosis present

## 2022-01-16 DIAGNOSIS — R252 Cramp and spasm: Secondary | ICD-10-CM | POA: Insufficient documentation

## 2022-01-16 NOTE — ED Provider Notes (Signed)
[facility] EMERGENCY DEPARTMENT Provider Note   CSN: [id number] Arrival date & time: 01/16/22  2031     History  Chief Complaint  Patient presents with   Leg Pain    Adin Lariccia is a 31 y.o. female.  Patient with intermittent left calf pain that onset about 30 minutes ago at 8 PM.  Pain comes and goes lasting for several seconds to minutes at a time.  Denies any fall or injury.  Has never had this pain before.  No weakness, numbness or tingling.  Pain feels like a cramp.  No difficulty breathing.  No chest pain.  No cough or fever.  No history of blood clot.  No blood thinner use or no birth control use  The history is provided by the patient.  Leg Pain      Home Medications Prior to Admission medications   Medication Sig Start Date End Date Taking? Authorizing Provider  benzonatate (TESSALON) 100 MG capsule Take 1 capsule (100 mg total) by mouth every 8 (eight) hours. 06/12/20   Bill Salinas, PA-C      Allergies    Patient has no known allergies.    Review of Systems   Review of Systems  Musculoskeletal:  Positive for arthralgias and myalgias.   all other systems are negative except as noted in the HPI and PMH.    Physical Exam Updated Vital Signs BP 119/82 (BP Location: Right Arm)   Pulse 68   Temp 98.3 F (36.8 C)   Resp 20   Ht 5\' 3"  (1.6 m)   Wt 104.3 kg   LMP 01/10/2022   SpO2 100%   BMI 40.74 kg/m  Physical Exam Vitals and nursing note reviewed.  Constitutional:      General: She is not in acute distress.    Appearance: She is well-developed.  HENT:     Head: Normocephalic and atraumatic.     Mouth/Throat:     Pharynx: No oropharyngeal exudate.  Eyes:     Conjunctiva/sclera: Conjunctivae normal.     Pupils: Pupils are equal, round, and reactive to light.  Neck:     Comments: No meningismus. Cardiovascular:     Rate and Rhythm: Normal rate and regular rhythm.     Heart sounds: Normal heart sounds. No murmur  heard. Pulmonary:     Effort: Pulmonary effort is normal. No respiratory distress.     Breath sounds: Normal breath sounds.  Abdominal:     Palpations: Abdomen is soft.     Tenderness: There is no abdominal tenderness. There is no guarding or rebound.  Musculoskeletal:        General: Tenderness present. Normal range of motion.     Cervical back: Normal range of motion and neck supple.     Comments: Left calf is nontender to palpation.  Compartments soft, no palpable cords.  Intact DP and PT pulses. Achilles function intact  Skin:    General: Skin is warm.  Neurological:     Mental Status: She is alert and oriented to person, place, and time.     Cranial Nerves: No cranial nerve deficit.     Motor: No abnormal muscle tone.     Coordination: Coordination normal.     Comments:  5/5 strength throughout. CN 2-12 intact.Equal grip strength.   Psychiatric:        Behavior: Behavior normal.     ED Results / Procedures / Treatments   Labs (all labs ordered are listed, but only  abnormal results are displayed) Labs Reviewed - No data to display  EKG None  Radiology US Venous Img Lower Unilateral Left (DVT)  Result Date: 01/16/2022 CLINICAL DATA:  Left lower extremity pain EXAM: LEFT LOWER EXTREMITY VENOUS DOPPLER ULTRASOUND TECHNIQUE: Gray-scale sonography with compression, as well as color and duplex ultrasound, were performed to evaluate the deep venous system(s) from the level of the common femoral vein through the popliteal and proximal calf veins. COMPARISON:  None Available. FINDINGS: VENOUS Normal compressibility of the common femoral, superficial femoral, and popliteal veins, as well as the visualized calf veins. Visualized portions of profunda femoral vein and great saphenous vein unremarkable. No filling defects to suggest DVT on grayscale or color Doppler imaging. Doppler waveforms show normal direction of venous flow, normal respiratory plasticity and response to augmentation.  Limited views of the contralateral common femoral vein are unremarkable. OTHER None. Limitations: none IMPRESSION: Negative. Electronically Signed   By: Charlett Nose M.D.   On: 01/16/2022 22:48    Procedures Procedures    Medications Ordered in ED Medications - No data to display  ED Course/ Medical Decision Making/ A&P                           Medical Decision Making Amount and/or Complexity of Data Reviewed Labs: ordered. Decision-making details documented in ED Course. Radiology: ordered and independent interpretation performed. Decision-making details documented in ED Course. ECG/medicine tests: ordered and independent interpretation performed. Decision-making details documented in ED Course.   Intermittent left calf pain onset this evening.  Neurovascular intact.  No asymmetric swelling or edema.  No chest pain or shortness of breath.  Doppler negative for DVT.  Patient declines any blood work.  Low suspicion for significant electrolyte abnormality.  Discussed oral hydration at home with Gatorade or Powerade.  NSAID medication for inflammation.  Follow-up with PCP.  Return precautions discussed        Final Clinical Impression(s) / ED Diagnoses Final diagnoses:  Leg cramps    Rx / DC Orders ED Discharge Orders     None         Deandra Goering, Jeannett Senior, MD 01/16/22 2308

## 2022-01-16 NOTE — ED Triage Notes (Signed)
Sharp pain in left calf  X 30 mins has had Hx of swelling in past.

## 2022-01-16 NOTE — Discharge Instructions (Signed)
Keep yourself hydrated with Gatorade or Powerade.  Your ultrasound shows no blood clot.  You may also use tonic water with quinine for leg cramps.  Follow-up with your primary doctor.  Return to the ED with new or worsening symptoms including chest pain or shortness of breath

## 2022-06-07 ENCOUNTER — Encounter (HOSPITAL_BASED_OUTPATIENT_CLINIC_OR_DEPARTMENT_OTHER): Payer: Self-pay | Admitting: Emergency Medicine

## 2022-06-07 ENCOUNTER — Other Ambulatory Visit: Payer: Self-pay

## 2022-06-07 ENCOUNTER — Emergency Department (HOSPITAL_BASED_OUTPATIENT_CLINIC_OR_DEPARTMENT_OTHER): Payer: Medicaid Other

## 2022-06-07 DIAGNOSIS — S82832A Other fracture of upper and lower end of left fibula, initial encounter for closed fracture: Secondary | ICD-10-CM | POA: Diagnosis not present

## 2022-06-07 DIAGNOSIS — S99912A Unspecified injury of left ankle, initial encounter: Secondary | ICD-10-CM | POA: Diagnosis present

## 2022-06-07 DIAGNOSIS — Y9351 Activity, roller skating (inline) and skateboarding: Secondary | ICD-10-CM | POA: Insufficient documentation

## 2022-06-07 NOTE — ED Triage Notes (Signed)
Pt c/o LT ankle pain after turning it while roller skating tonight

## 2022-06-08 ENCOUNTER — Emergency Department (HOSPITAL_BASED_OUTPATIENT_CLINIC_OR_DEPARTMENT_OTHER)
Admission: EM | Admit: 2022-06-08 | Discharge: 2022-06-08 | Disposition: A | Discharge status: Home / Self Care | Payer: Medicaid Other | Attending: Emergency Medicine | Admitting: Emergency Medicine

## 2022-06-08 DIAGNOSIS — S8265XA Nondisplaced fracture of lateral malleolus of left fibula, initial encounter for closed fracture: Secondary | ICD-10-CM

## 2022-06-08 MED ORDER — HYDROCODONE-ACETAMINOPHEN 5-325 MG PO TABS: 1.0000 | ORAL_TABLET | Freq: Four times a day (QID) | ORAL | 0 refills | Status: AC | PRN

## 2022-06-08 MED ORDER — HYDROCODONE-ACETAMINOPHEN 5-325 MG PO TABS
2.0000 | ORAL_TABLET | Freq: Once | ORAL | Status: AC
  Administered 2022-06-08: 2 via ORAL
  Filled 2022-06-08: qty 2

## 2022-06-08 NOTE — ED Provider Notes (Signed)
  [facility] EMERGENCY DEPARTMENT Provider Note   CSN: [id number] Arrival date & time: 06/07/22  2240     History  Chief Complaint  Patient presents with   Ankle Pain    Angelica Walsh is a 31 y.o. female.  Patient is a 31 year old female presenting with a left ankle injury.  Patient was rollerskating this evening when she fell and turned her ankle.  She reports hearing a crack, followed by pain.  She has had swelling and inability to bear weight since.  There are no alleviating factors.  The history is provided by the patient.       Home Medications Prior to Admission medications   Medication Sig Start Date End Date Taking? Authorizing Provider  benzonatate (TESSALON) 100 MG capsule Take 1 capsule (100 mg total) by mouth every 8 (eight) hours. 06/12/20   Bill Salinas, PA-C      Allergies    Patient has no known allergies.    Review of Systems   Review of Systems  All other systems reviewed and are negative.   Physical Exam Updated Vital Signs BP 120/79   Pulse 89   Temp 98.1 F (36.7 C) (Oral)   Resp 18   Ht 5\' 3"  (1.6 m)   Wt 111.1 kg   LMP 05/07/2022   SpO2 99%   BMI 43.40 kg/m  Physical Exam Vitals and nursing note reviewed.  Constitutional:      General: She is not in acute distress.    Appearance: Normal appearance. She is not ill-appearing.  HENT:     Head: Normocephalic and atraumatic.  Pulmonary:     Effort: Pulmonary effort is normal.  Musculoskeletal:     Comments: There is swelling and tenderness noted over the lateral malleolus.  There is no fifth metatarsal tenderness or medial malleoli or tenderness.  DP pulses are palpable and motor and sensation are intact distally.  Skin:    General: Skin is warm and dry.  Neurological:     Mental Status: She is alert.     ED Results / Procedures / Treatments   Labs (all labs ordered are listed, but only abnormal results are displayed) Labs Reviewed - No data to  display  EKG None  Radiology DG Ankle Complete Left  Result Date: 06/07/2022 CLINICAL DATA:  Left ankle pain after turning while roller-skating. EXAM: LEFT ANKLE COMPLETE - 3+ VIEW COMPARISON:  None Available. FINDINGS: There is mildly displaced oblique fracture of the distal fibula at the level of the syndesmotic ligament. Ankle mortise appears congruent. No other appreciable fracture. Marked soft tissue swelling about the lateral aspect of the ankle. IMPRESSION: Mildly displaced oblique fracture of the distal fibula at the level of the syndesmotic ligament. Electronically Signed   By: 06/09/2022 D.O.   On: 06/07/2022 23:42    Procedures Procedures    Medications Ordered in ED Medications  HYDROcodone-acetaminophen (NORCO/VICODIN) 5-325 MG per tablet 2 tablet (has no administration in time range)    ED Course/ Medical Decision Making/ A&P  X-rays show a left distal fibular fracture with no significant displacement.  Patient to be immobilized in a cam walker, given crutches, advised to ice, elevate, and follow-up with orthopedics in the next 2 to 3 days.  Final Clinical Impression(s) / ED Diagnoses Final diagnoses:  None    Rx / DC Orders ED Discharge Orders     None         06/09/2022, MD 06/08/22 0028

## 2022-06-08 NOTE — Discharge Instructions (Signed)
Nonweightbearing until followed up by orthopedics.  Elevate your ankle is much as possible for the next several days.  Ice for 20 minutes every 2 hours while awake for the next 2 days.  Take ibuprofen 600 mg every 6 hours as needed for pain.  Take hydrocodone as prescribed as needed for pain not relieved with ibuprofen.  You are to follow-up with orthopedics in the next 2 to 3 days.  Call on Tuesday to make these arrangements.  The contact information for Dr. Susa Simmonds has been provided in this discharge summary.

## 2022-06-08 NOTE — ED Notes (Signed)
Correction in charting. Boot was placed on pt's left ankle.

## 2022-09-28 IMAGING — DX DG CHEST 2V
2 series · 2 of 2 positions shown · non-contrast
Comparison: Radiograph 06/02/2019

CLINICAL DATA: Shortness of breath, heartburn for several days

EXAM:
CHEST - 2 VIEW

[chest pa]
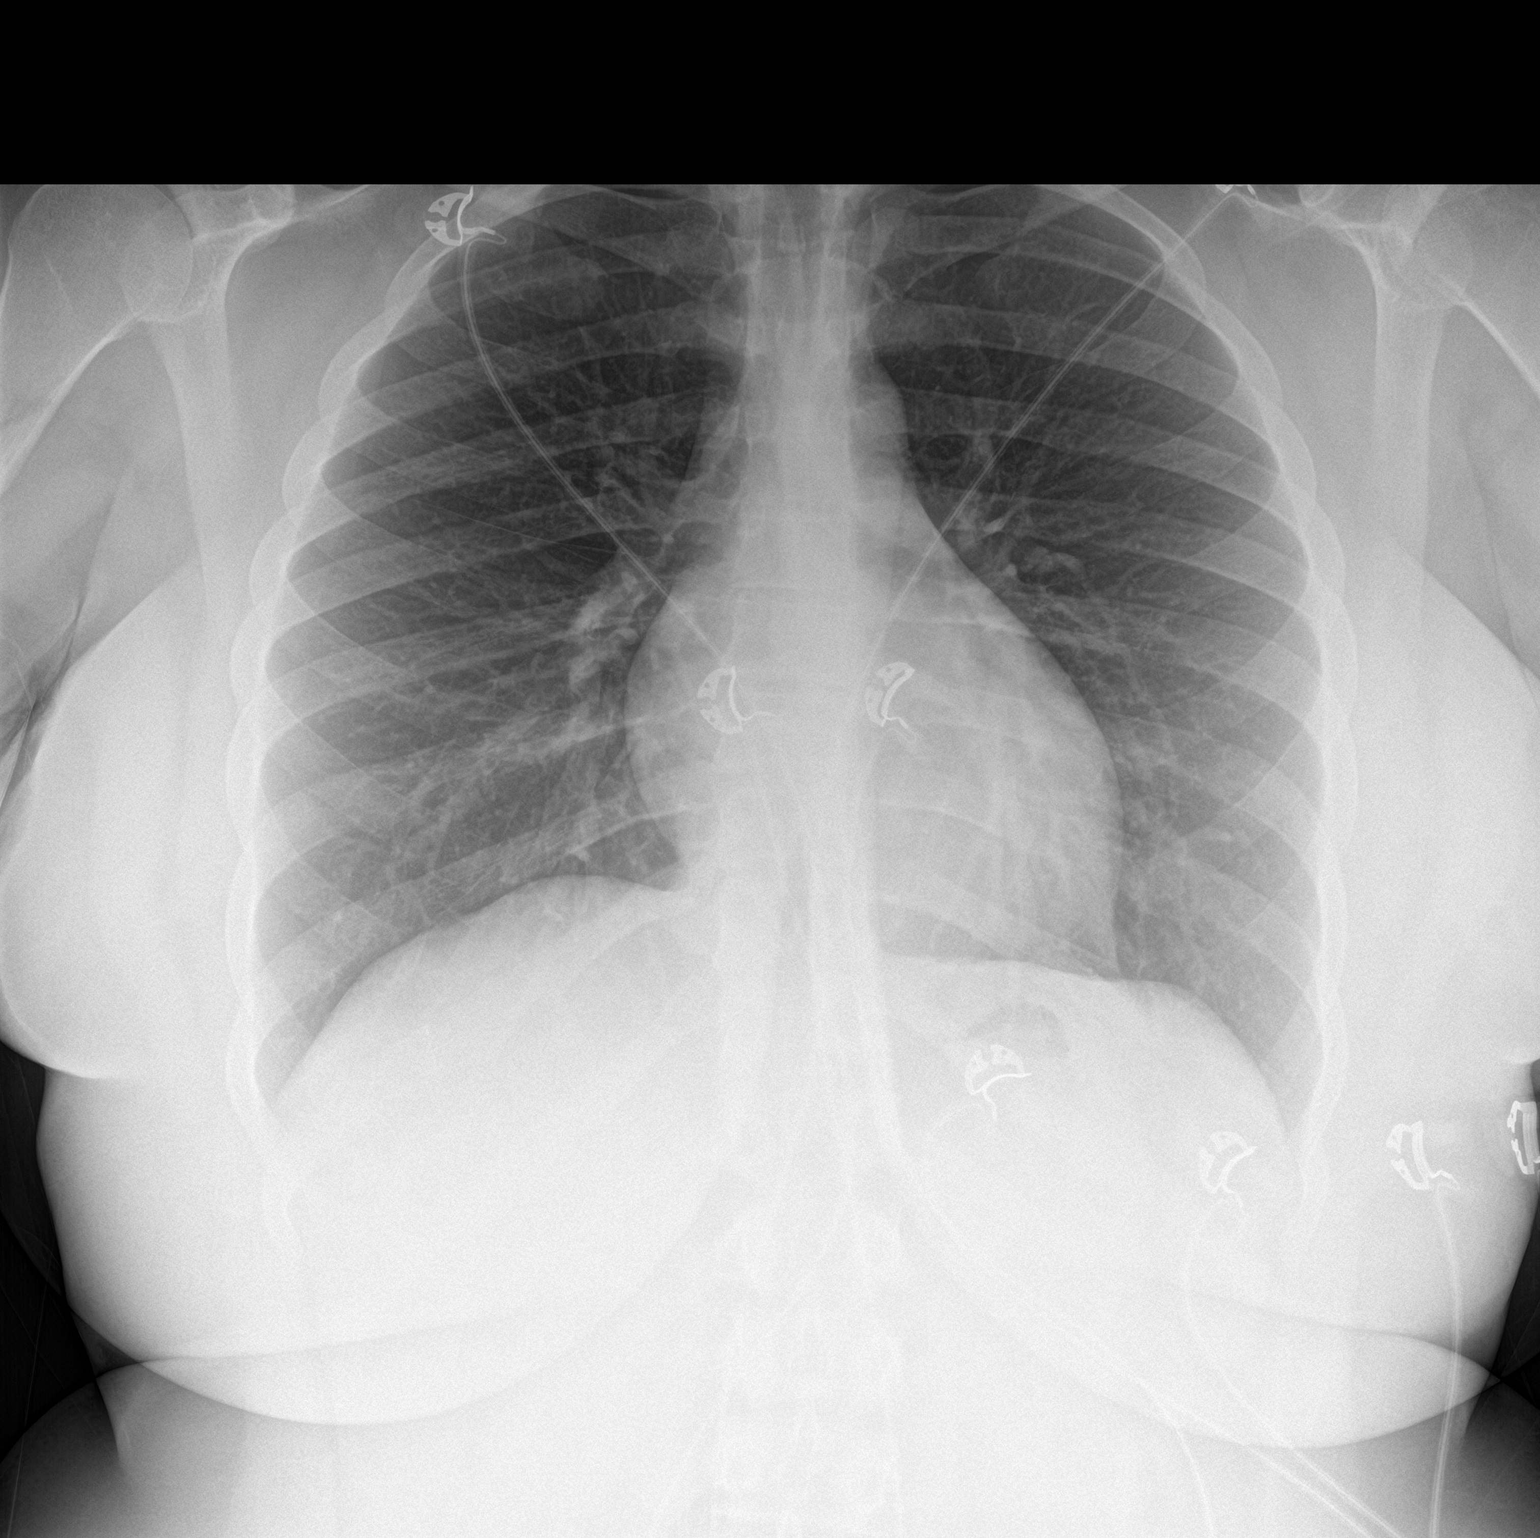

[chest lat]
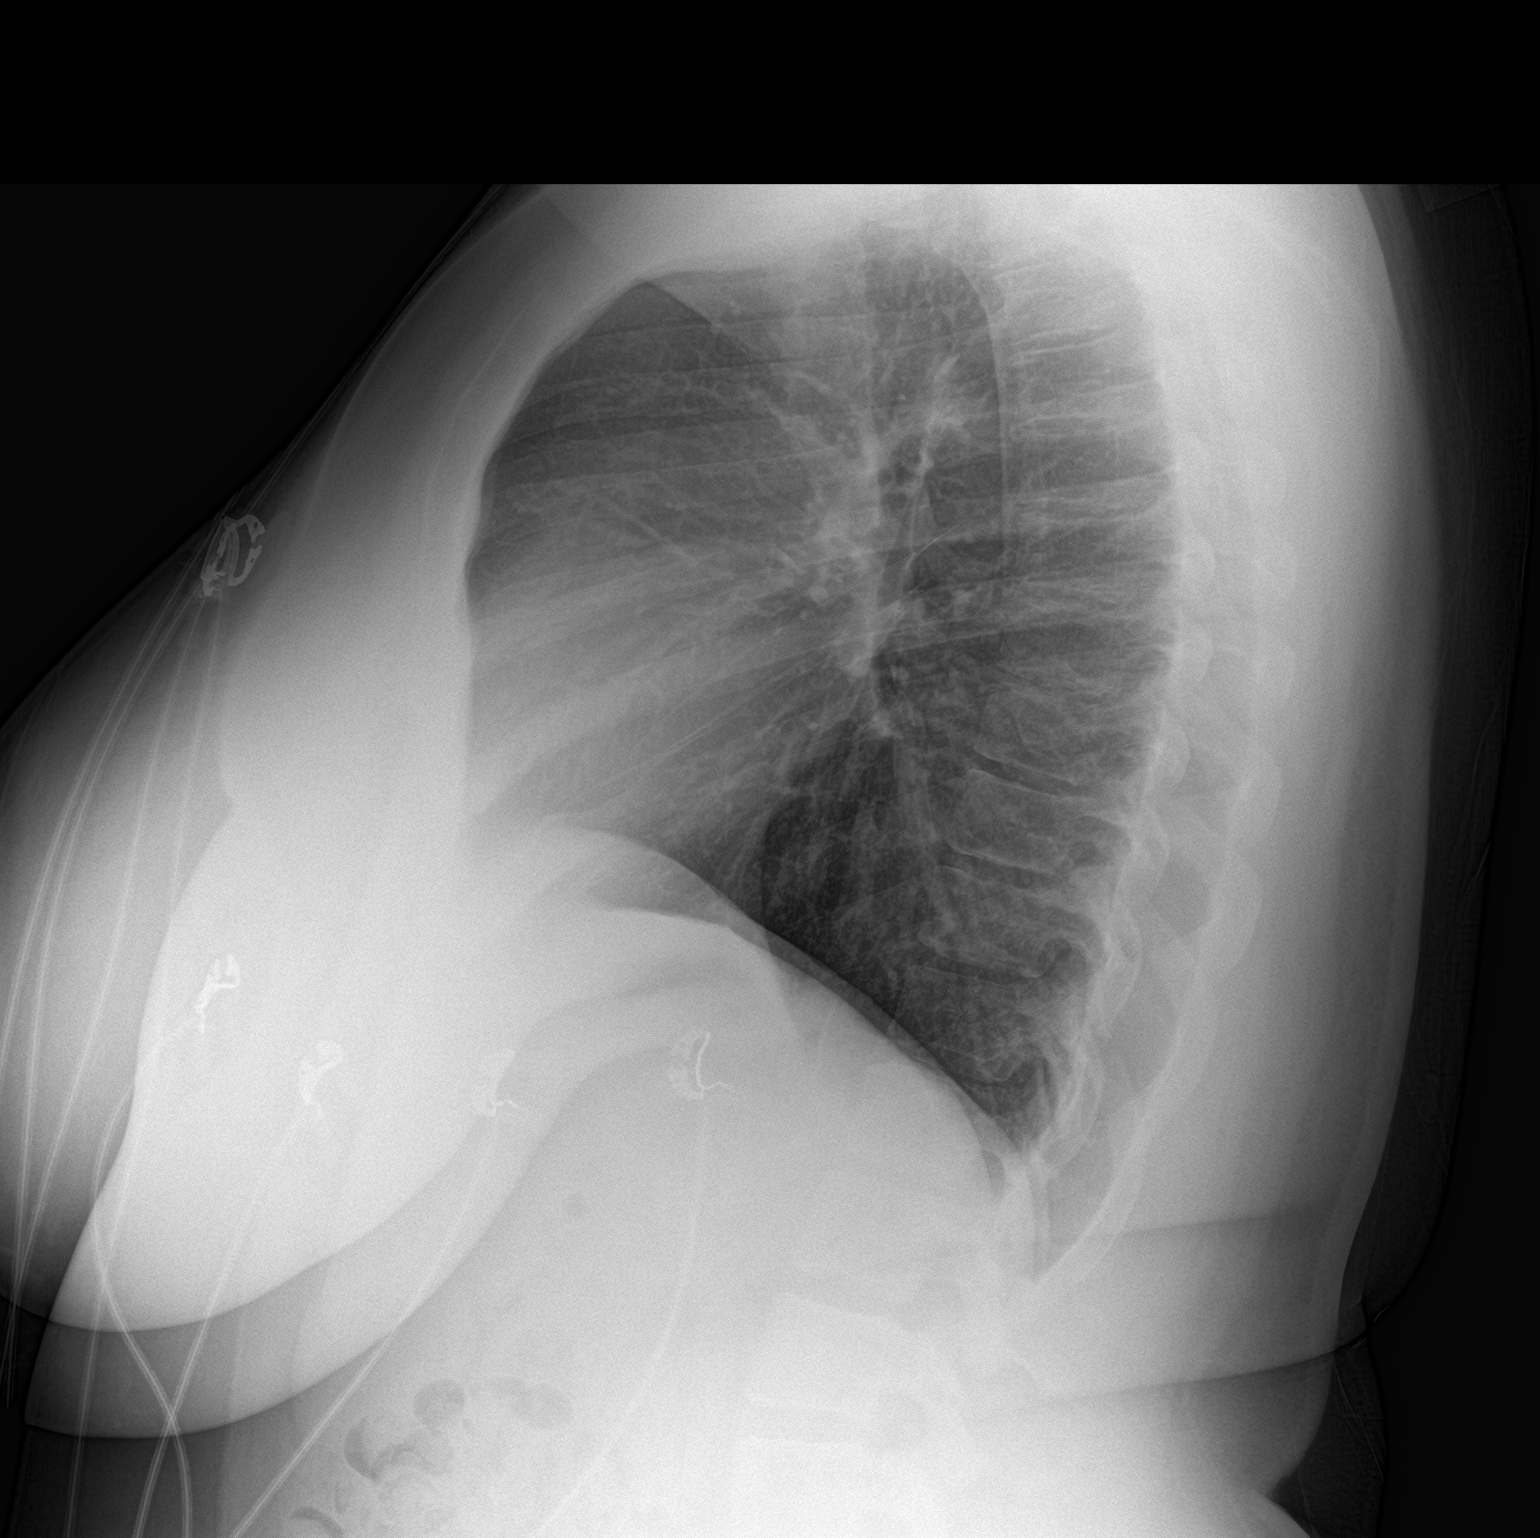

[2 of 2 positions shown; findings below may reference images not displayed]

FINDINGS: No consolidation, features of edema, pneumothorax, or effusion.
Pulmonary vascularity is normally distributed. The cardiomediastinal
contours are unremarkable. No acute osseous or soft tissue
abnormality.
IMPRESSION: No acute cardiopulmonary abnormality.
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
